# Patient Record
Sex: Male | Born: 1970 | Race: White | Hispanic: No | Marital: Married | Smoking: Current some day smoker
Health system: Southern US, Community
[De-identification: ages and names within clinical notes are randomized; demographics above are authoritative.]

## PROBLEM LIST (undated history)

## (undated) HISTORY — PX: GASTRIC BYPASS: SHX52

---

## 2006-07-01 ENCOUNTER — Emergency Department: Payer: Self-pay | Admitting: Emergency Medicine

## 2009-07-14 ENCOUNTER — Ambulatory Visit: Payer: Self-pay | Admitting: Internal Medicine

## 2010-07-13 ENCOUNTER — Emergency Department: Payer: Self-pay | Admitting: Emergency Medicine

## 2010-08-01 ENCOUNTER — Ambulatory Visit: Payer: Self-pay | Admitting: General Practice

## 2011-05-03 ENCOUNTER — Emergency Department: Payer: Self-pay | Admitting: Emergency Medicine

## 2015-01-02 ENCOUNTER — Ambulatory Visit: Payer: Self-pay | Admitting: Family Medicine

## 2018-05-17 ENCOUNTER — Emergency Department
Admission: EM | Admit: 2018-05-17 | Discharge: 2018-05-18 | Disposition: A | Discharge status: Home / Self Care | Payer: Self-pay | Attending: Emergency Medicine | Admitting: Emergency Medicine

## 2018-05-17 ENCOUNTER — Encounter: Payer: Self-pay | Admitting: Emergency Medicine

## 2018-05-17 ENCOUNTER — Other Ambulatory Visit: Payer: Self-pay

## 2018-05-17 DIAGNOSIS — T7805XA Anaphylactic reaction due to tree nuts and seeds, initial encounter: Secondary | ICD-10-CM | POA: Insufficient documentation

## 2018-05-17 DIAGNOSIS — T782XXA Anaphylactic shock, unspecified, initial encounter: Secondary | ICD-10-CM

## 2018-05-17 DIAGNOSIS — R21 Rash and other nonspecific skin eruption: Secondary | ICD-10-CM | POA: Insufficient documentation

## 2018-05-17 DIAGNOSIS — T7840XA Allergy, unspecified, initial encounter: Secondary | ICD-10-CM

## 2018-05-17 NOTE — ED Notes (Signed)
Ice chips provided.

## 2018-05-17 NOTE — ED Provider Notes (Signed)
[facility] Emergency Department Provider Note   ____________________________________________   First MD Initiated Contact with Patient 05/17/18 2307     (approximate)  I have reviewed the triage vital signs and the nursing notes.   HISTORY  Chief Complaint Allergic Reaction    HPI Mario Day is a 47 y.o. male who comes into the hospital today with an allergic reaction.  The patient states that he feels better but reports that this evening he was eating some cake that he thinks had some walnuts in it.  He reports that after eating the cake his face became red and he felt like he could not breathe.  He went to the bathroom to try to watch his face and developed some hives over his chest and some itching.  They called EMS and the patient did receive 2 EpiPen's as well as some Solu-Medrol, Benadryl and and Pepcid.  The patient states that a year ago he had a similar allergic reaction to peanuts.  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  He is here today for evaluation of his symptoms.   History reviewed. No pertinent past medical history.  There are no active problems to display for this patient.   Past Surgical History:  Procedure Laterality Date  . GASTRIC BYPASS      Prior to Admission medications   Medication Sig Start Date End Date Taking? Authorizing Provider  EPINEPHrine 0.3 mg/0.3 mL IJ SOAJ injection Inject 0.3 mLs (0.3 mg total) into the muscle once for 1 dose. As needed for facial swelling or difficulty breathing 05/18/18 05/18/18  Rebecka ApleyWebster, Finnlee Silvernail P, MD  famotidine (PEPCID) 40 MG tablet Take 1 tablet (40 mg total) by mouth every evening. 05/18/18 05/18/19  Rebecka ApleyWebster, Asaph Serena P, MD  predniSONE (DELTASONE) 20 MG tablet Take 3 tablets (60 mg total) by mouth daily. 05/18/18   Rebecka ApleyWebster, Briza Bark P, MD    Allergies Peanut-containing drug products  No family history on file.  Social  History Social History   Tobacco Use  . Smoking status: Current Some Day Smoker  . Smokeless tobacco: Never Used  Substance Use Topics  . Alcohol use: Yes  . Drug use: Not on file    Review of Systems  Constitutional: No fever/chills Eyes: No visual changes. ENT: No sore throat. Cardiovascular: Denies chest pain. Respiratory:  shortness of breath. Gastrointestinal: No abdominal pain.  No nausea, no vomiting.  No diarrhea.  No constipation. Genitourinary: Negative for dysuria. Musculoskeletal: Negative for back pain. Skin: hives and itching Neurological: Negative for headaches, focal weakness or numbness.   ____________________________________________   PHYSICAL EXAM:  VITAL SIGNS: ED Triage Vitals  Enc Vitals Group     BP 05/17/18 2244 (!) 141/89     Pulse Rate 05/17/18 2244 91     Resp 05/17/18 2244 (!) 24     Temp --      Temp src --      SpO2 05/17/18 2244 99 %     Weight 05/17/18 2246 195 lb (88.5 kg)     Height 05/17/18 2246 5\' 5"  (1.651 m)     Head Circumference --      Peak Flow --      Pain Score 05/17/18 2256 0     Pain Loc --      Pain Edu? --      Excl. in GC? --     Constitutional: Alert  and oriented. Well appearing and in no acute distress. Eyes: Conjunctivae are normal. PERRL. EOMI. Head: Atraumatic. Nose: No congestion/rhinnorhea. Mouth/Throat: Mucous membranes are moist.  Oropharynx non-erythematous. Cardiovascular: Normal rate, regular rhythm. Grossly normal heart sounds.  Good peripheral circulation. Respiratory: Normal respiratory effort.  No retractions. Lungs CTAB. Gastrointestinal: Soft and nontender. No distention.  Musculoskeletal: No lower extremity tenderness nor edema.   Neurologic:  Normal speech and language.  Skin:  Skin is warm, dry and intact.  Psychiatric: Mood and affect are normal.   ____________________________________________   LABS (all labs ordered are listed, but only abnormal results are displayed)  Labs  Reviewed - No data to display ____________________________________________  EKG  none ____________________________________________  RADIOLOGY  ED MD interpretation:  none  Official radiology report(s): No results found.  ____________________________________________   PROCEDURES  Procedure(s) performed: None  Procedures  Critical Care performed: No  ____________________________________________   INITIAL IMPRESSION / ASSESSMENT AND PLAN / ED COURSE  As part of my medical decision making, I reviewed the following data within the electronic MEDICAL RECORD NUMBER Notes from prior ED visits and [place] Controlled Substance Database   This is a 47 year old male who comes into the hospital today with an allergic reaction.  The patient received his medications by EMS.  By the time I evaluated the patient he was feeling much better and his hives had improved.  He has some mild skin erythema but he is breathing comfortably with no wheezing.  Since the patient did receive epinephrine he will be monitored for approximately 4 hours to ensure that he does not have any rebound of his allergic symptoms.  We will observe the patient during his time in the emergency room.  After 4 hours the patient states that he still feels good and he is ready to go home.  The patient will be discharged to follow-up with an allergist for evaluation of his allergy.      ____________________________________________   FINAL CLINICAL IMPRESSION(S) / ED DIAGNOSES  Final diagnoses:  Anaphylaxis, initial encounter  Allergic reaction, initial encounter     ED Discharge Orders        Ordered    predniSONE (DELTASONE) 20 MG tablet  Daily     05/18/18 0311    famotidine (PEPCID) 40 MG tablet  Every evening     05/18/18 0311    EPINEPHrine 0.3 mg/0.3 mL IJ SOAJ injection   Once     05/18/18 9147       Note:  This document was prepared using Dragon voice recognition software and may include unintentional  dictation errors.    Rebecka Apley, MD 05/18/18 [phone]

## 2018-05-17 NOTE — ED Notes (Signed)
Dr. Roxan Hockeyobinson at bedside to evaluate airway.

## 2018-05-17 NOTE — ED Notes (Signed)
md in to see pt.  

## 2018-05-17 NOTE — ED Notes (Signed)
Pt reports feeling "much better". No angioedema noted. No hives noted currently. resps unlabored.

## 2018-05-17 NOTE — ED Triage Notes (Signed)
Per ems pt with anaphylaxis. Pt with hives noted to chest wall, abd. Ems states hives have improved. Pt is allergic to nuts. Pt received epi 0.3mg  x2, solumedrol 125mg  iv, pepcid 20mg  iv, benadryl 50mg  iv, 4mg  zofran. Pt states he feels like his chest is heavy and "my throat is closing".

## 2018-05-18 MED ORDER — PREDNISONE 20 MG PO TABS: 60.0000 mg | ORAL_TABLET | Freq: Every day | ORAL | 0 refills | Status: AC

## 2018-05-18 MED ORDER — FAMOTIDINE 40 MG PO TABS
40.0000 mg | ORAL_TABLET | Freq: Every evening | ORAL | 0 refills | Status: AC
Start: 2018-05-18 — End: 2019-05-18

## 2018-05-18 MED ORDER — EPINEPHRINE 0.3 MG/0.3ML IJ SOAJ: 0.3000 mg | Freq: Once | INTRAMUSCULAR | 0 refills | Status: AC

## 2018-05-18 NOTE — ED Notes (Signed)
Pt up to commode to void.  

## 2018-05-18 NOTE — Discharge Instructions (Signed)
Please follow up with an allergist for further evaluation of your symptoms. Please return with any other concerns

## 2018-05-18 NOTE — ED Notes (Signed)
Pt states he wants to go home. md notified.

## 2018-05-18 NOTE — ED Notes (Signed)
Pt reports feeling much improved. resps unlabored.

## 2018-05-18 NOTE — ED Notes (Signed)
Pt states "I feel really good". Explanation of continued monitoring time provided again to pt and spouse who verbalize understanding.

## 2018-06-28 ENCOUNTER — Emergency Department: Payer: Worker's Compensation

## 2018-06-28 ENCOUNTER — Emergency Department
Admission: EM | Admit: 2018-06-28 | Discharge: 2018-06-28 | Disposition: A | Discharge status: Home / Self Care | Payer: Worker's Compensation | Attending: Emergency Medicine | Admitting: Emergency Medicine

## 2018-06-28 ENCOUNTER — Encounter: Payer: Self-pay | Admitting: Emergency Medicine

## 2018-06-28 ENCOUNTER — Other Ambulatory Visit: Payer: Self-pay

## 2018-06-28 DIAGNOSIS — F17200 Nicotine dependence, unspecified, uncomplicated: Secondary | ICD-10-CM | POA: Diagnosis not present

## 2018-06-28 DIAGNOSIS — Z79899 Other long term (current) drug therapy: Secondary | ICD-10-CM | POA: Insufficient documentation

## 2018-06-28 DIAGNOSIS — Y9301 Activity, walking, marching and hiking: Secondary | ICD-10-CM | POA: Insufficient documentation

## 2018-06-28 DIAGNOSIS — S99911A Unspecified injury of right ankle, initial encounter: Secondary | ICD-10-CM | POA: Diagnosis present

## 2018-06-28 DIAGNOSIS — Y998 Other external cause status: Secondary | ICD-10-CM | POA: Diagnosis not present

## 2018-06-28 DIAGNOSIS — Z91010 Allergy to peanuts: Secondary | ICD-10-CM | POA: Diagnosis not present

## 2018-06-28 DIAGNOSIS — W010XXA Fall on same level from slipping, tripping and stumbling without subsequent striking against object, initial encounter: Secondary | ICD-10-CM | POA: Insufficient documentation

## 2018-06-28 DIAGNOSIS — S82851A Displaced trimalleolar fracture of right lower leg, initial encounter for closed fracture: Secondary | ICD-10-CM | POA: Diagnosis not present

## 2018-06-28 DIAGNOSIS — Y929 Unspecified place or not applicable: Secondary | ICD-10-CM | POA: Insufficient documentation

## 2018-06-28 MED ORDER — NAPROXEN 500 MG PO TABS: 500.0000 mg | ORAL_TABLET | Freq: Two times a day (BID) | ORAL | 0 refills | Status: AC

## 2018-06-28 MED ORDER — ETOMIDATE 2 MG/ML IV SOLN
0.3000 mg/kg | Freq: Once | INTRAVENOUS | Status: DC
  Filled 2018-06-28: qty 20

## 2018-06-28 MED ORDER — ETOMIDATE 2 MG/ML IV SOLN
INTRAVENOUS | Status: AC | PRN
  Administered 2018-06-28: 27.9 mg via INTRAVENOUS

## 2018-06-28 MED ORDER — OXYCODONE-ACETAMINOPHEN 5-325 MG PO TABS
1.0000 | ORAL_TABLET | Freq: Once | ORAL | Status: AC
  Administered 2018-06-28: 1 via ORAL
  Filled 2018-06-28: qty 1

## 2018-06-28 MED ORDER — SODIUM CHLORIDE 0.9 % IV BOLUS
1000.0000 mL | Freq: Once | INTRAVENOUS | Status: AC
  Administered 2018-06-28: 1000 mL via INTRAVENOUS

## 2018-06-28 MED ORDER — MORPHINE SULFATE (PF) 4 MG/ML IV SOLN
4.0000 mg | Freq: Once | INTRAVENOUS | Status: AC
  Administered 2018-06-28: 4 mg via INTRAVENOUS
  Filled 2018-06-28: qty 1

## 2018-06-28 MED ORDER — OXYCODONE-ACETAMINOPHEN 5-325 MG PO TABS: 1.0000 | ORAL_TABLET | Freq: Four times a day (QID) | ORAL | 0 refills | Status: AC | PRN

## 2018-06-28 NOTE — ED Notes (Signed)
Pt received 100 mcg fentanyl 

## 2018-06-28 NOTE — Sedation Documentation (Signed)
Etomidate was given over 2 doses. Ankle reduced and splint applied by dr and techs

## 2018-06-28 NOTE — ED Notes (Signed)
Pt states he works at [employer] through [employer]. At work [employer] is not listed in our computer. Advised pt to call his supervisor to see if they need anything (ie: urine drug screen or BAT). Pt states he will take care of it and for us "not to worry about it".

## 2018-06-28 NOTE — ED Notes (Signed)
Pt ate all of his food tray

## 2018-06-28 NOTE — Sedation Documentation (Signed)
Pt alert, holding a conversation. States the pain is down to 5/10. Awaiting post reductions xr.

## 2018-06-28 NOTE — Sedation Documentation (Signed)
Vital signs stable. 

## 2018-06-28 NOTE — ED Provider Notes (Signed)
[facility] Emergency Department Provider Note  ____________________________________________  Time seen: Approximately 7:19 PM  I have reviewed the triage vital signs and the nursing notes.   HISTORY  Chief Complaint Ankle Injury    HPI Mario Day is a 47 y.o. male with no significant past medical history who was in his usual state of health when he slipped on a wet floor.  Discussed him to fall straight back, landing on his back.  He denies head injury or loss of consciousness.  He did note that his ankle seem to turn sideways and he had immediate severe sudden pain that was nonradiating, worse with any movement, no alleviating factors, aching.  No other complaints other than ankle pain.   Last ate this morning 1030 today.   History reviewed. No pertinent past medical history.   There are no active problems to display for this patient.    Past Surgical History:  Procedure Laterality Date  . GASTRIC BYPASS       Prior to Admission medications   Medication Sig Start Date End Date Taking? Authorizing Provider  famotidine (PEPCID) 40 MG tablet Take 1 tablet (40 mg total) by mouth every evening. 05/18/18 05/18/19  Rebecka Apley, MD  naproxen (NAPROSYN) 500 MG tablet Take 1 tablet (500 mg total) by mouth 2 (two) times daily with a meal. 06/28/18   Sharman Cheek, MD  oxyCODONE-acetaminophen (PERCOCET) 5-325 MG tablet Take 1 tablet by mouth every 6 (six) hours as needed for severe pain. 06/28/18 06/28/19  Sharman Cheek, MD  predniSONE (DELTASONE) 20 MG tablet Take 3 tablets (60 mg total) by mouth daily. 05/18/18   Rebecka Apley, MD     Allergies Peanut-containing drug products   History reviewed. No pertinent family history.  Social History Social History   Tobacco Use  . Smoking status: Current Some Day Smoker  . Smokeless tobacco: Never Used  Substance Use Topics  . Alcohol use: Yes  . Drug use: Not on file    Review of  Systems  Constitutional:   No fever or chills.  ENT:   No sore throat. No rhinorrhea. Cardiovascular:   No chest pain or syncope. Respiratory:   No dyspnea or cough. Gastrointestinal:   Negative for abdominal pain, vomiting and diarrhea.  Musculoskeletal:   Negative for focal pain or swelling All other systems reviewed and are negative except as documented above in ROS and HPI.  ____________________________________________   PHYSICAL EXAM:  VITAL SIGNS: ED Triage Vitals  Enc Vitals Group     BP 06/28/18 1822 126/67     Pulse Rate 06/28/18 1822 (!) 54     Resp 06/28/18 1822 16     Temp 06/28/18 1822 98.8 F (37.1 C)     Temp Source 06/28/18 1822 Oral     SpO2 06/28/18 1822 99 %     Weight 06/28/18 1823 205 lb (93 kg)     Height 06/28/18 1823 5\' 10"  (1.778 m)     Head Circumference --      Peak Flow --      Pain Score 06/28/18 1823 7     Pain Loc --      Pain Edu? --      Excl. in GC? --     Vital signs reviewed, nursing assessments reviewed.   Constitutional:   Alert and oriented. Non-toxic appearance. Eyes:   Conjunctivae are normal. EOMI. PERRL. ENT      Head:   Normocephalic and atraumatic.  Nose:   No congestion/rhinnorhea.  No epistaxis      Mouth/Throat:   MMM, no pharyngeal erythema. No peritonsillar mass.       Neck:   No meningismus. Full ROM.  No midline spinal tenderness Hematological/Lymphatic/Immunilogical:   No cervical lymphadenopathy. Cardiovascular:   RRR. Symmetric bilateral radial and DP pulses.  No murmurs. Cap refill less than 2 seconds. Respiratory:   Normal respiratory effort without tachypnea/retractions. Breath sounds are clear and equal bilaterally. No wheezes/rales/rhonchi. Gastrointestinal:   Soft and nontender. Non distended. There is no CVA tenderness.  No rebound, rigidity, or guarding.  Musculoskeletal:   Normal range of motion in all extremities except right ankle which is obviously deformed with tenting of the skin in the area of  the medial malleolus..  Toes are warm, well-perfused, normal capillary refill Neurologic:   Normal speech and language.  Motor grossly intact. No acute focal neurologic deficits are appreciated.  Skin:    Skin is warm, dry and intact. No rash noted.  No petechiae, purpura, or bullae.  No laceration ____________________________________________    LABS (pertinent positives/negatives) (all labs ordered are listed, but only abnormal results are displayed) Labs Reviewed - No data to display ____________________________________________   EKG    ____________________________________________    RADIOLOGY  Dg Ankle 2 Views Right  Result Date: 06/28/2018 CLINICAL DATA:  Postreduction EXAM: RIGHT ANKLE - 2 VIEW COMPARISON:  06/28/2018 FINDINGS: Interval reduction of the previously seen dislocated right ankle. Trimalleolar fracture again noted with near anatomic alignment. IMPRESSION: Interval reduction of the trimalleolar fracture dislocation. Electronically Signed   By: Charlett Nose M.D.   On: 06/28/2018 21:02   Dg Ankle Complete Right  Result Date: 06/28/2018 CLINICAL DATA:  Fall, ankle pain EXAM: RIGHT ANKLE - COMPLETE 3+ VIEW COMPARISON:  None. FINDINGS: Fracture dislocation at the right ankle. Trimalleolar fracture noted. There is posterior dislocation of the talus relative to the tibia. IMPRESSION: Trimalleolar fracture dislocation. Electronically Signed   By: Charlett Nose M.D.   On: 06/28/2018 19:03    ____________________________________________   PROCEDURES .Sedation Date/Time: 06/28/2018 7:19 PM Performed by: Sharman Cheek, MD Authorized by: Sharman Cheek, MD   Consent:    Consent obtained:  Verbal (electronic informed consent)   Consent given by:  Patient   Risks discussed:  Allergic reaction, dysrhythmia, inadequate sedation, nausea, vomiting, respiratory compromise necessitating ventilatory assistance and intubation, prolonged sedation necessitating reversal and  prolonged hypoxia resulting in organ damage   Alternatives discussed:  Analgesia without sedation Universal protocol:    Procedure explained and questions answered to patient or proxy's satisfaction: yes     Relevant documents present and verified: yes     Test results available and properly labeled: yes     Imaging studies available: yes     Required blood products, implants, devices, and special equipment available: yes     Immediately prior to procedure a time out was called: yes     Patient identity confirmation method:  Arm band and verbally with patient Indications:    Procedure performed:  Fracture reduction   Procedure necessitating sedation performed by:  Physician performing sedation   Intended level of sedation:  Moderate (conscious sedation) Pre-sedation assessment:    Time since last food or drink:  9 hours   ASA classification: class 1 - normal, healthy patient     Neck mobility: normal     Mouth opening:  3 or more finger widths   Thyromental distance:  3 finger widths   Mallampati  score:  I - soft palate, uvula, fauces, pillars visible   Pre-sedation assessments completed and reviewed: airway patency, cardiovascular function, hydration status, mental status, nausea/vomiting, pain level and respiratory function     Pre-sedation assessment completed:  06/28/2018 7:20 PM Immediate pre-procedure details:    Reassessment: Patient reassessed immediately prior to procedure     Reviewed: vital signs, relevant labs/tests and NPO status     Verified: bag valve mask available, emergency equipment available, intubation equipment available, IV patency confirmed, oxygen available, reversal medications available and suction available   Procedure details (see MAR for exact dosages):    Preoxygenation:  Nasal cannula   Sedation:  Etomidate   Intra-procedure monitoring:  Blood pressure monitoring, continuous pulse oximetry, cardiac monitor, frequent vital sign checks and frequent LOC  assessments   Intra-procedure events: none     Total Provider sedation time (minutes):  10 Post-procedure details:    Post-sedation assessment completed:  06/28/2018 8:23 PM   Attendance: Constant attendance by certified staff until patient recovered     Recovery: Patient returned to pre-procedure baseline     Post-sedation assessments completed and reviewed: airway patency, cardiovascular function, hydration status, mental status, nausea/vomiting, pain level and respiratory function     Patient is stable for discharge or admission: yes     Patient tolerance:  Tolerated well, no immediate complications Comments:     Verbal consent obtained from patient in presence of spouse, who agree with procedure and reduction of fracture/dislocation     Reduction of fracture Date/Time: 06/28/2018 8:24 PM Performed by: Sharman CheekStafford, Zayn Selley, MD Authorized by: Sharman CheekStafford, Blaire Palomino, MD  Consent: Verbal consent obtained. Risks and benefits: risks, benefits and alternatives were discussed Consent given by: patient Patient understanding: patient states understanding of the procedure being performed Patient consent: the patient's understanding of the procedure matches consent given Procedure consent: procedure consent matches procedure scheduled Relevant documents: relevant documents present and verified Imaging studies: imaging studies available Patient identity confirmed: verbally with patient and arm band Time out: Immediately prior to procedure a "time out" was called to verify the correct patient, procedure, equipment, support staff and site/side marked as required. Local anesthesia used: no  Anesthesia: Local anesthesia used: no  Sedation: Patient sedated: yes Sedatives: etomidate (see sedation template) Vitals: Vital signs were monitored during sedation.  Patient tolerance: Patient tolerated the procedure well with no immediate complications  .Splint Application Date/Time: 06/28/2018 9:48  PM Performed by: Sharman CheekStafford, Taiquan Campanaro, MD Authorized by: Sharman CheekStafford, Patrick Sohm, MD   Consent:    Consent obtained:  Verbal   Consent given by:  Patient   Risks discussed:  Discoloration, pain, swelling and numbness   Alternatives discussed:  Referral Pre-procedure details:    Sensation:  Normal Procedure details:    Laterality:  Right   Location:  Ankle   Ankle:  R ankle   Cast type:  Short leg   Splint type:  Ankle stirrup and stack splint   Supplies:  Ortho-Glass Post-procedure details:    Pain:  Improved   Sensation:  Normal   Skin color:  Pink   Patient tolerance of procedure:  Tolerated well, no immediate complications Comments:     Applied by me with ED tech assistance    ____________________________________________    CLINICAL IMPRESSION / ASSESSMENT AND PLAN / ED COURSE  Pertinent labs & imaging results that were available during my care of the patient were reviewed by me and considered in my medical decision making (see chart for details).  Clinical Course as of Jun 28 2146  Sat Jun 28, 2018  1610 Patient presents with clinically apparent right ankle injury, fracture versus dislocation or both.  Will obtain x-ray of the right ankle.  This appears to be an isolated injury.  C-spine is clinically clear.  IV morphine 4 mg for additional pain control.  Discussed potential moderate/deep sedation with the patient for reduction and splinting, to which he agrees.  Last ate at 10 AM today.   [PS]  2031 D/w ortho - requests to send pre and post reduction xrays to advise on follow up plan.   [PS]  2108 D/w ortho, adequate reduction, outpatient follow up. NWB on crutches.    [PS]    Clinical Course User Index [PS] Sharman Cheek, MD     ----------------------------------------- 8:26 PM on 06/28/2018 -----------------------------------------  Fracture reduction completed under moderate sedation.  No desaturations or other intraprocedure complications.  3 sided  splint applied, ankle in 90 degree dorsiflexion.  After splint application, intact sensation, distal pulse in the dorsalis pedis, and normal color and capillary refill less than 2 seconds in the toes.  Repeat xray to eval reduction  ----------------------------------------- 9:46 PM on 06/28/2018 ----------------------------------------- Pain much improved, 5/10.  Tolerating oral intake.  Suitable for discharge home with crutches.  ____________________________________________   FINAL CLINICAL IMPRESSION(S) / ED DIAGNOSES    Final diagnoses:  Trimalleolar fracture of ankle, closed, right, initial encounter     ED Discharge Orders        Ordered    naproxen (NAPROSYN) 500 MG tablet  2 times daily with meals     06/28/18 2141    oxyCODONE-acetaminophen (PERCOCET) 5-325 MG tablet  Every 6 hours PRN     06/28/18 2141      Portions of this note were generated with dragon dictation software. Dictation errors may occur despite best attempts at proofreading.    Sharman Cheek, MD 06/28/18 2152

## 2018-06-28 NOTE — Sedation Documentation (Signed)
Patient is resting comfortably. 

## 2018-06-28 NOTE — ED Triage Notes (Signed)
Pt from work - slipped on wet floor - rt ankle injury with deformity

## 2018-08-07 ENCOUNTER — Other Ambulatory Visit: Payer: Self-pay

## 2018-08-07 ENCOUNTER — Emergency Department: Payer: PRIVATE HEALTH INSURANCE

## 2018-08-07 ENCOUNTER — Emergency Department
Admission: EM | Admit: 2018-08-07 | Discharge: 2018-08-07 | Disposition: A | Discharge status: Other Acute Inpatient Hospital | Payer: PRIVATE HEALTH INSURANCE | Attending: Emergency Medicine | Admitting: Emergency Medicine

## 2018-08-07 ENCOUNTER — Encounter: Payer: Self-pay | Admitting: Emergency Medicine

## 2018-08-07 DIAGNOSIS — F17200 Nicotine dependence, unspecified, uncomplicated: Secondary | ICD-10-CM | POA: Diagnosis not present

## 2018-08-07 DIAGNOSIS — W109XXA Fall (on) (from) unspecified stairs and steps, initial encounter: Secondary | ICD-10-CM | POA: Diagnosis not present

## 2018-08-07 DIAGNOSIS — S52501A Unspecified fracture of the lower end of right radius, initial encounter for closed fracture: Secondary | ICD-10-CM | POA: Insufficient documentation

## 2018-08-07 DIAGNOSIS — Y9301 Activity, walking, marching and hiking: Secondary | ICD-10-CM | POA: Insufficient documentation

## 2018-08-07 DIAGNOSIS — S0292XA Unspecified fracture of facial bones, initial encounter for closed fracture: Secondary | ICD-10-CM | POA: Diagnosis not present

## 2018-08-07 DIAGNOSIS — Y999 Unspecified external cause status: Secondary | ICD-10-CM | POA: Diagnosis not present

## 2018-08-07 DIAGNOSIS — Z23 Encounter for immunization: Secondary | ICD-10-CM | POA: Insufficient documentation

## 2018-08-07 DIAGNOSIS — Y929 Unspecified place or not applicable: Secondary | ICD-10-CM | POA: Diagnosis not present

## 2018-08-07 DIAGNOSIS — S59911A Unspecified injury of right forearm, initial encounter: Secondary | ICD-10-CM | POA: Diagnosis present

## 2018-08-07 MED ORDER — MORPHINE SULFATE (PF) 4 MG/ML IV SOLN
4.0000 mg | Freq: Once | INTRAVENOUS | Status: AC
  Administered 2018-08-07: 4 mg via INTRAVENOUS

## 2018-08-07 MED ORDER — MORPHINE SULFATE (PF) 4 MG/ML IV SOLN
INTRAVENOUS | Status: AC
  Filled 2018-08-07: qty 1

## 2018-08-07 MED ORDER — TETANUS-DIPHTH-ACELL PERTUSSIS 5-2.5-18.5 LF-MCG/0.5 IM SUSP
0.5000 mL | Freq: Once | INTRAMUSCULAR | Status: AC
  Administered 2018-08-07: 0.5 mL via INTRAMUSCULAR
  Filled 2018-08-07: qty 0.5

## 2018-08-07 MED ORDER — HYDROMORPHONE HCL 1 MG/ML IJ SOLN
1.0000 mg | Freq: Once | INTRAMUSCULAR | Status: AC
  Administered 2018-08-07: 1 mg via INTRAVENOUS
  Filled 2018-08-07: qty 1
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
## 2018-08-07 NOTE — ED Provider Notes (Signed)
[facility] Emergency Department Provider Note  ____________________________________________  Time seen: Approximately 2:55 PM  I have reviewed the triage vital signs and the nursing notes.   HISTORY  Chief Complaint Fall    HPI Mario Day is a 47 y.o. male with no significant past medical history who is about 4 weeks postop from a ORIF of right trimalleolar ankle fracture.  Today he was going downstairs when his leg "gave out" due to the persistent pain in his ankle, causing him to fall down the stairs and hit his head against the wall.  No loss of consciousness.  He currently complains of pain in the left face but denies neck pain.  No vision changes.  No paresthesias or weakness.  Also has pain in the right wrist.  Pain is acute onset, severe, nonradiating, worse with movement, no alleviating factors.  Right ankle pain is at baseline after the fall   History reviewed. No pertinent past medical history.   There are no active problems to display for this patient.    Past Surgical History:  Procedure Laterality Date  . GASTRIC BYPASS       Prior to Admission medications   Medication Sig Start Date End Date Taking? Authorizing Provider  famotidine (PEPCID) 40 MG tablet Take 1 tablet (40 mg total) by mouth every evening. Patient not taking: Reported on 08/07/2018 05/18/18 05/18/19  Rebecka Apley, MD  naproxen (NAPROSYN) 500 MG tablet Take 1 tablet (500 mg total) by mouth 2 (two) times daily with a meal. Patient not taking: Reported on 08/07/2018 06/28/18   Sharman Cheek, MD  oxyCODONE-acetaminophen (PERCOCET) 5-325 MG tablet Take 1 tablet by mouth every 6 (six) hours as needed for severe pain. Patient not taking: Reported on 08/07/2018 06/28/18 06/28/19  Sharman Cheek, MD  predniSONE (DELTASONE) 20 MG tablet Take 3 tablets (60 mg total) by mouth daily. Patient not taking: Reported on 08/07/2018 05/18/18   Rebecka Apley, MD      Allergies Peanut-containing drug products   History reviewed. No pertinent family history.  Social History Social History   Tobacco Use  . Smoking status: Current Some Day Smoker  . Smokeless tobacco: Never Used  Substance Use Topics  . Alcohol use: Yes  . Drug use: Not on file    Review of Systems  Constitutional:   No fever or chills.  ENT:   No sore throat. No rhinorrhea. Cardiovascular:   No chest pain or syncope. Respiratory:   No dyspnea or cough. Gastrointestinal:   Negative for abdominal pain, vomiting and diarrhea.  Musculoskeletal: Right forearm fracture.  Chronic right ankle fracture All other systems reviewed and are negative except as documented above in ROS and HPI.  ____________________________________________   PHYSICAL EXAM:  VITAL SIGNS: ED Triage Vitals  Enc Vitals Group     BP 08/07/18 0912 (!) 137/93     Pulse Rate 08/07/18 0912 62     Resp 08/07/18 0912 16     Temp 08/07/18 0912 (!) 97.4 F (36.3 C)     Temp Source 08/07/18 0912 Oral     SpO2 08/07/18 0912 98 %     Weight 08/07/18 0910 205 lb (93 kg)     Height 08/07/18 0910 5\' 10"  (1.778 m)     Head Circumference --      Peak Flow --      Pain Score 08/07/18 0910 7     Pain Loc --      Pain Edu? --  Excl. in GC? --     Vital signs reviewed, nursing assessments reviewed.   Constitutional:   Alert and oriented. Non-toxic appearance. Eyes:   Conjunctivae are normal. EOMI. PERRL. ENT      Head:   Normocephalic with swelling bruising and diffuse tenderness around the left orbit, zygoma, maxilla.  No crepitus.  No laceration.  No hemotympanum.      Nose:   No congestion/rhinnorhea.  Nontender, no epistaxis, no septal hematoma      Mouth/Throat:   MMM, no pharyngeal erythema. No peritonsillar mass.       Neck:   No meningismus. Full ROM.  No midline tenderness Hematological/Lymphatic/Immunilogical:   No cervical lymphadenopathy. Cardiovascular:   RRR. Symmetric bilateral  radial and DP pulses.  No murmurs. Cap refill less than 2 seconds. Respiratory:   Normal respiratory effort without tachypnea/retractions. Breath sounds are clear and equal bilaterally. No wheezes/rales/rhonchi. Gastrointestinal:   Soft and nontender. Non distended. There is no CVA tenderness.  No rebound, rigidity, or guarding. Genitourinary:   deferred Musculoskeletal:   Right distal radius and wrist swelling and tenderness.  Deformity.  Right proximal forearm and elbow unremarkable.  Hand unremarkable.  Other extremity is unremarkable.  Right lower leg in a walking boot without tenderness or swelling. Neurologic:   Normal speech and language.  Motor grossly intact. No acute focal neurologic deficits are appreciated.  Skin:    Skin is warm, dry and intact. No rash noted.  No petechiae, purpura, or bullae.  ____________________________________________    LABS (pertinent positives/negatives) (all labs ordered are listed, but only abnormal results are displayed) Labs Reviewed - No data to display ____________________________________________   EKG    ____________________________________________    RADIOLOGY  Dg Forearm Right  Result Date: 08/07/2018 CLINICAL DATA:  Right distal forearm pain. EXAM: RIGHT FOREARM - 2 VIEW COMPARISON:  No recent prior. FINDINGS: Comminuted fracture of the distal radius with extension into the radiocarpal joint space noted. Angulation deformity. Tiny bony densities noted posterior to the proximal carpal row. These could represent small fracture fragments, possibly from the triquetrum. IMPRESSION: 1. Comminuted fracture of the distal radius with extension into the radiocarpal joint space. Angulation deformity. 2. Tiny bony densities noted posterior to the proximal carpal row. These could represent small fracture fragments, possibly from the triquetrum. Electronically Signed   By: [name]  Register   On: 08/07/2018 10:09   Ct Maxillofacial Wo  Contrast  Result Date: 08/07/2018 CLINICAL DATA:  47 year old male with acute LEFT facial pain and swelling following fall. Initial encounter. EXAM: CT MAXILLOFACIAL WITHOUT CONTRAST TECHNIQUE: Multidetector CT imaging of the maxillofacial structures was performed. Multiplanar CT image reconstructions were also generated. COMPARISON:  07/18/2011 head CT FINDINGS: Osseous: Multiple fractures are noted as follows: A comminuted LEFT orbital floor fracture without definite displacement. No gross ocular muscle herniation noted. Fracture of the LATERAL LEFT orbital wall with some depression of the LATERAL orbital wall in 6 mm displacement at the junction of the LATERAL orbital wall and LEFT zygoma. Nondisplaced MEDIAL LEFT orbital wall fracture. Nondisplaced fractures of the LEFT zygoma and LEFT nasal bone. Fractures of the ANTERIOR, LATERAL and MEDIAL LEFT maxillary sinus walls as well as the anterior maxillary process. The pterygoid plates are intact. Orbits: Fractures as described above. The globes retain their spherical shape. No intraconal abnormality. Sinuses: Fluid/blood within the LEFT maxillary sinus noted. Soft tissues: Facial soft tissue swelling is identified, LEFT-greater-than-RIGHT. Small foci of gas within the LEFT facial soft tissues noted. Limited intracranial: No acute  abnormality. IMPRESSION: 1. Facial fractures as described including the LEFT orbital floor without definite displacement or gross extraocular muscle herniation, MEDIAL and LATERAL LEFT orbital wall, LEFT zygoma, LEFT nasal bone, and LEFT maxillary sinus walls. Overlying soft tissue swelling. Electronically Signed   By: Harmon Pier M.D.   On: 08/07/2018 10:39    ____________________________________________   PROCEDURES .Splint Application Date/Time: 08/07/2018 2:58 PM Performed by: Sharman Cheek, MD Authorized by: Sharman Cheek, MD   Consent:    Consent obtained:  Verbal   Consent given by:  Patient   Risks discussed:   Discoloration and numbness   Alternatives discussed:  No treatment Pre-procedure details:    Sensation:  Normal Procedure details:    Laterality:  Right   Location:  Arm   Splint type:  Sugar tong   Supplies:  Ortho-Glass Post-procedure details:    Pain:  Unchanged   Sensation:  Normal   Patient tolerance of procedure:  Tolerated well, no immediate complications    ____________________________________________  DIFFERENTIAL DIAGNOSIS   Facial fracture, orbital floor blowout fracture, distal radius fracture  CLINICAL IMPRESSION / ASSESSMENT AND PLAN / ED COURSE  Pertinent labs & imaging results that were available during my care of the patient were reviewed by me and considered in my medical decision making (see chart for details).    Patient presents after a fall.  Exam concerning for distal forearm fracture and facial fractures.  X-ray of the right forearm does show a distal radius fracture with extension into the intra-articular surface and possible carpal fracture.  CT of the face also shows extensive fractures including the medial, inferior, lateral walls of the orbit, zygoma, and medial lateral and inferior maxilla.  Overall this represents significant instability of the facial structure.  Case was discussed with ED attending May Yen at [facility] who accepts for trauma evaluation.  Initial fracture management provided with continued IV Dilaudid for pain control and sugar tong splint application to the right forearm.  No specific treatment for the facial fractures at this time pending specialist consultation but we will place a cervical collar for transport.        ____________________________________________   FINAL CLINICAL IMPRESSION(S) / ED DIAGNOSES    Final diagnoses:  Closed fracture of distal end of right radius, unspecified fracture morphology, initial encounter  Multiple facial fractures, closed, initial encounter [facility])     ED Discharge Orders    None       Portions of this note were generated with dragon dictation software. Dictation errors may occur despite best attempts at proofreading.    Sharman Cheek, MD 08/07/18 1500

## 2018-08-07 NOTE — ED Notes (Signed)
Patient transported to radiology

## 2018-08-07 NOTE — ED Notes (Signed)
Waiting on [facility] to transport to [facility] ED

## 2018-08-07 NOTE — ED Notes (Addendum)
Pt reports pain begins mid-forearm on right arm and hurts to right hand. Able to wiggle fingers, although difficult. Swelling noted to right lower forearm/wrist.  Pt has bruising below left eye.

## 2018-08-07 NOTE — ED Notes (Signed)
Pt back in room from radiology.

## 2018-08-07 NOTE — ED Notes (Signed)
EMTALA reviewed by this RN.  

## 2018-08-07 NOTE — ED Triage Notes (Signed)
Pt had surgery to right ankle 2 weeks ago at [facility]. Has been out of pain meds and unable to get in touch with them to refill.  Pt was walking down stairs and right leg gave way from pain and fell down about 5 stairs.  Abrasions and bruising to left side of face.  Appears to have mild deformity to right wrist.

## 2018-08-07 NOTE — ED Notes (Signed)
Tammy, EMT-P, applied splint per MD order.

## 2019-09-17 IMAGING — CT CT MAXILLOFACIAL W/O CM
3 series · 16 of 47 positions shown, 19 images · non-contrast
Comparison: 07/18/2011 head CT

CLINICAL DATA: 47-year-old male with acute LEFT facial pain and
swelling following fall. Initial encounter.

EXAM:
CT MAXILLOFACIAL WITHOUT CONTRAST
TECHNIQUE: Multidetector CT imaging of the maxillofacial structures was
performed. Multiplanar CT image reconstructions were also generated.

[Series 2: max soft · axial · 0.36mm/px · z∈[-307,-167]mm · 10 of 82 slices shown, 13 images]
[im 6/82  brain]
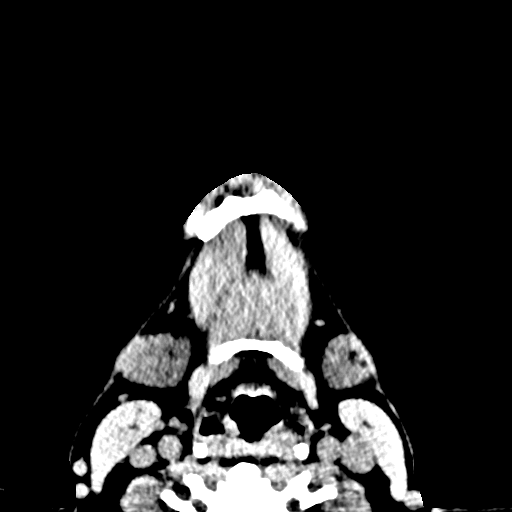
[im 6/82  bone]
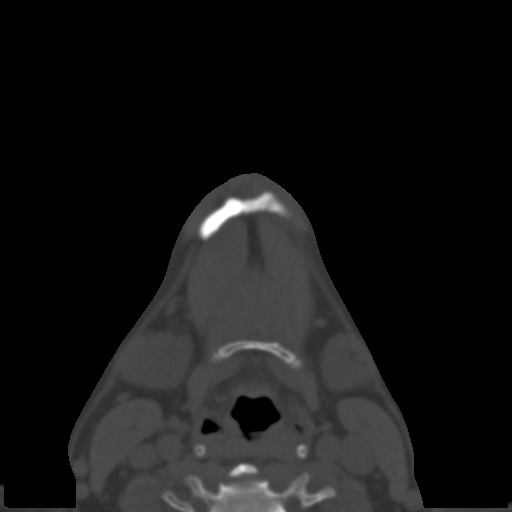
[im 14/82  bone]
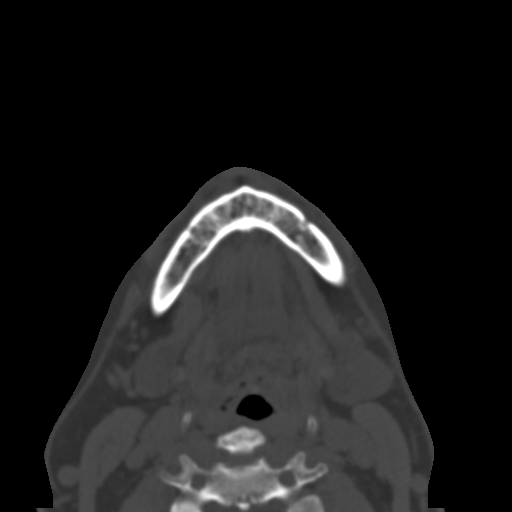
[im 23/82  bone]
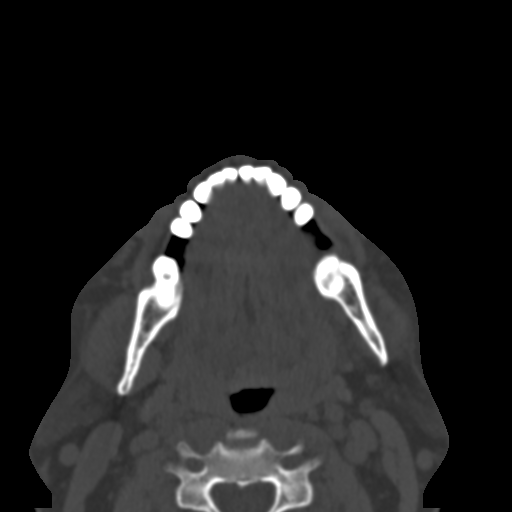
[im 28/82  bone]
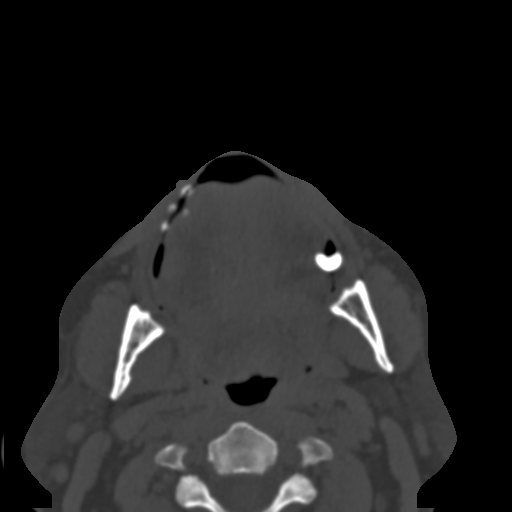
[im 37/82  brain]
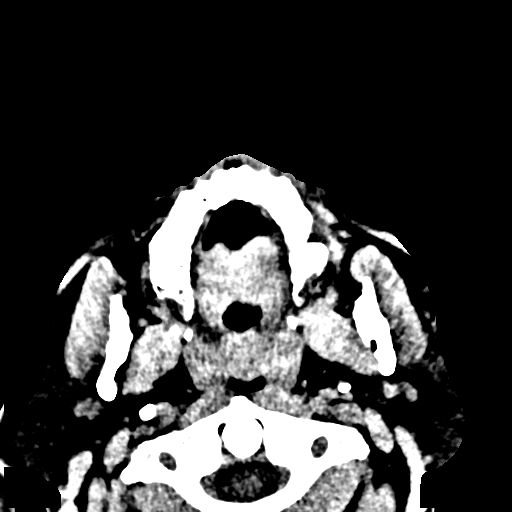
[im 37/82  bone]
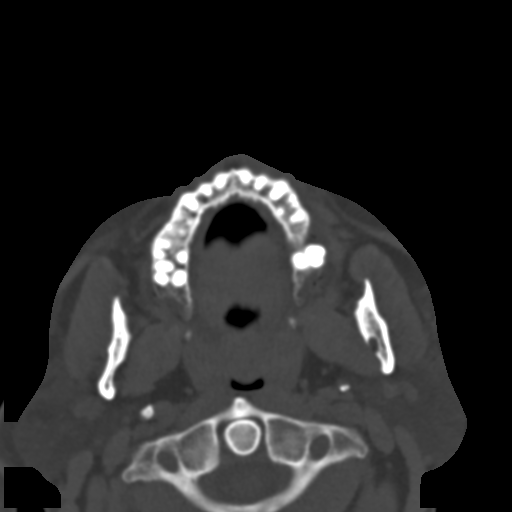
[im 45/82  bone]
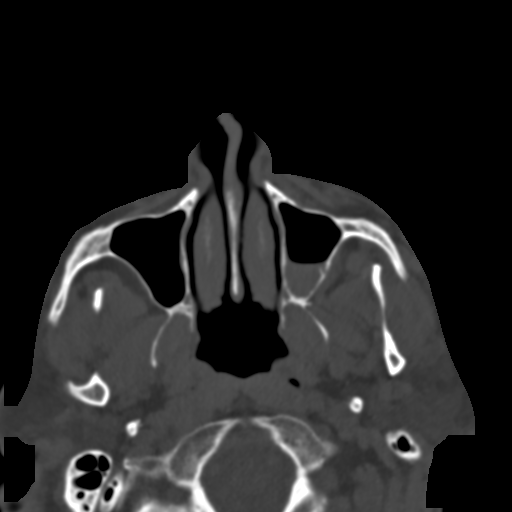
[im 54/82  bone]
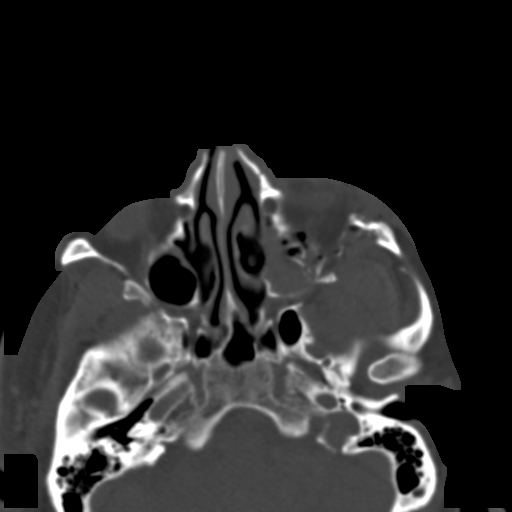
[im 62/82  bone]
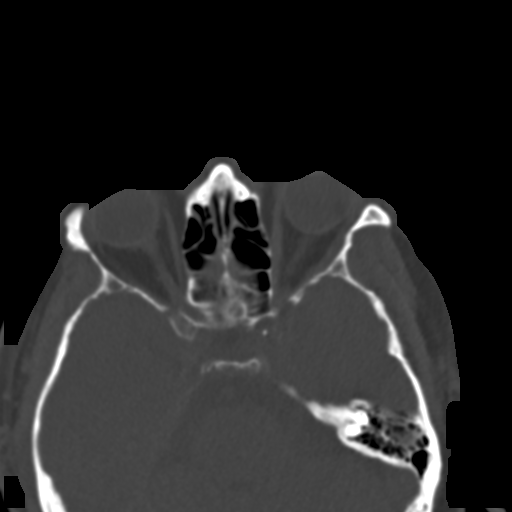
[im 68/82  brain]
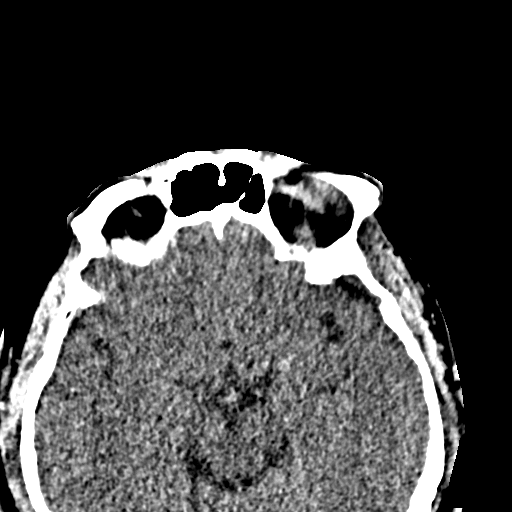
[im 68/82  bone]
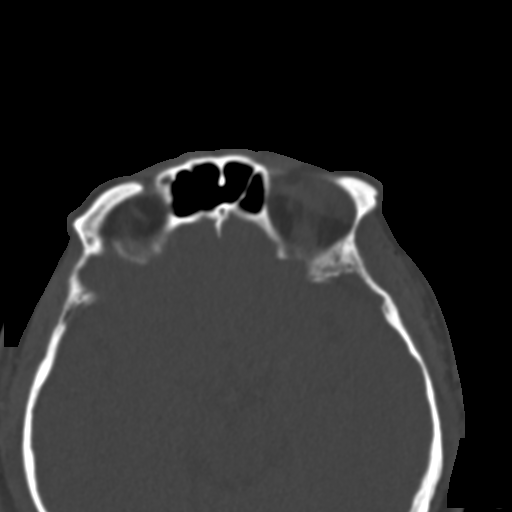
[im 76/82  bone]
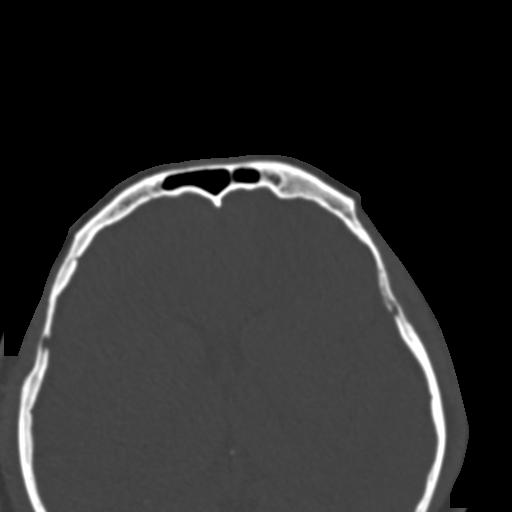

[Series 6: coronal soft · coronal · 0.35mm/px · 3 of 74 slices shown]
[im 25/74  bone]
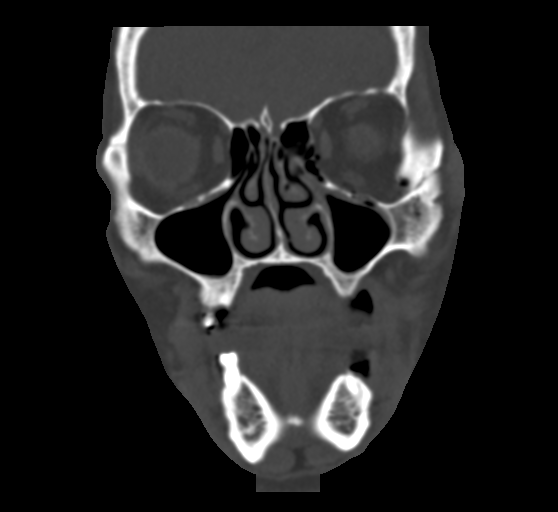
[im 33/74  bone]
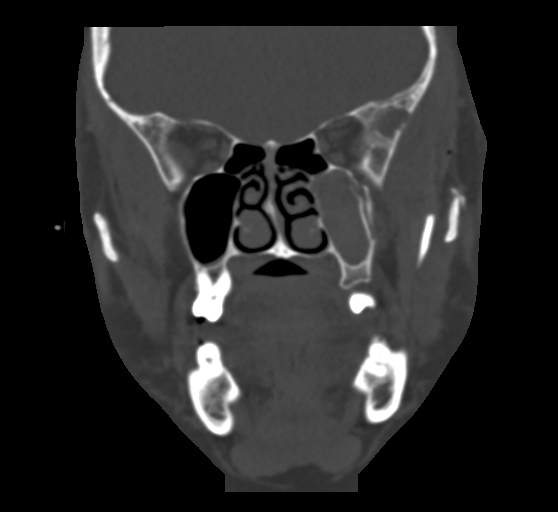
[im 41/74  bone]
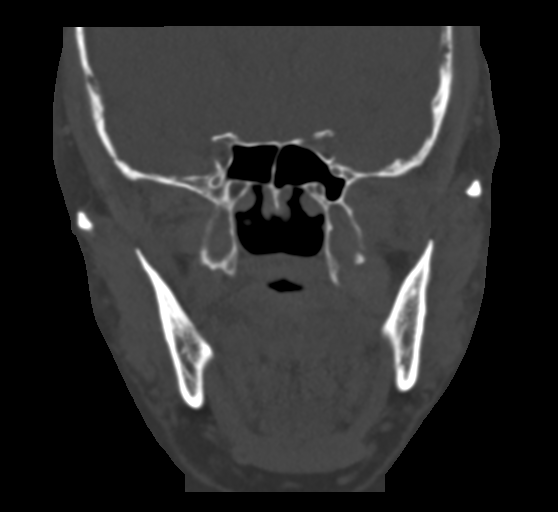

[Series 7: sagittal soft · sagittal · 0.36mm/px · 3 of 89 slices shown]
[im 30/89  bone]
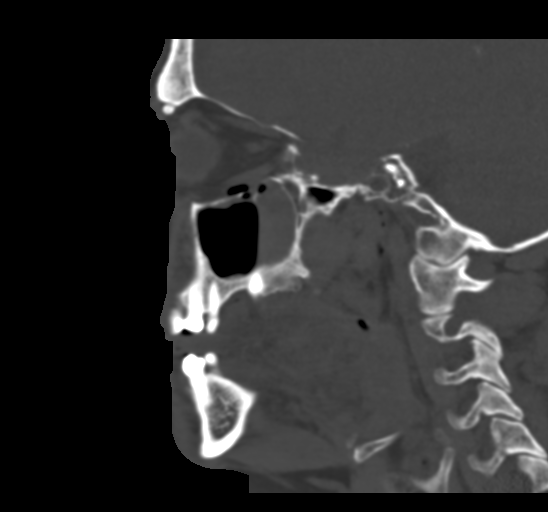
[im 45/89  bone]
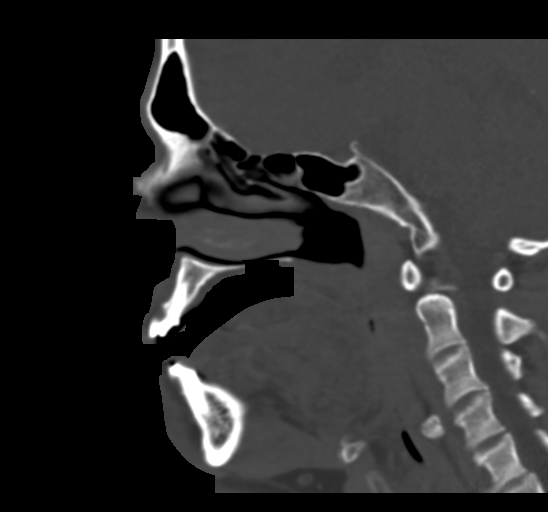
[im 59/89  bone]
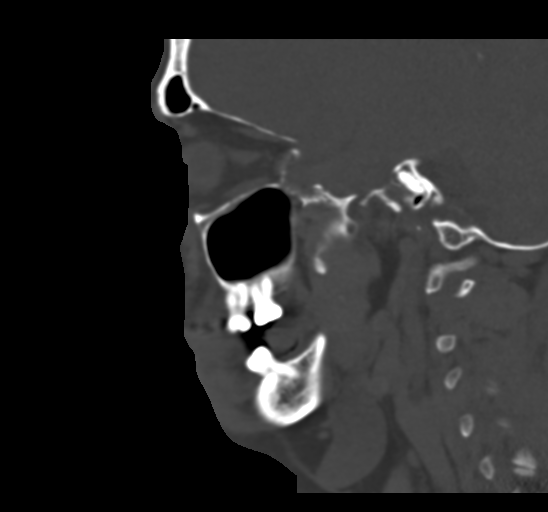

[16 of 47 positions shown; findings below may reference images not displayed]

FINDINGS: Osseous: Multiple fractures are noted as follows:

A comminuted LEFT orbital floor fracture without definite
displacement. No gross ocular muscle herniation noted.

Fracture of the LATERAL LEFT orbital wall with some depression of
the LATERAL orbital wall in 6 mm displacement at the junction of the
LATERAL orbital wall and LEFT zygoma..

Nondisplaced MEDIAL LEFT orbital wall fracture.

Nondisplaced fractures of the LEFT zygoma and LEFT nasal bone.

Fractures of the ANTERIOR, LATERAL and MEDIAL LEFT maxillary sinus
walls as well as the anterior maxillary process.

The pterygoid plates are intact.

Orbits: Fractures as described above. The globes retain their
spherical shape. No intraconal abnormality.

Sinuses: Fluid/blood within the LEFT maxillary sinus noted.

Soft tissues: Facial soft tissue swelling is identified,
LEFT-greater-than-RIGHT. Small foci of gas within the LEFT facial
soft tissues noted.

Limited intracranial: No acute abnormality.
IMPRESSION: 1. Facial fractures as described including the LEFT orbital floor
without definite displacement or gross extraocular muscle
herniation, MEDIAL and LATERAL LEFT orbital wall, LEFT zygoma, LEFT
nasal bone, and LEFT maxillary sinus walls. Overlying soft tissue
swelling.

## 2020-03-07 ENCOUNTER — Ambulatory Visit: Payer: PRIVATE HEALTH INSURANCE | Attending: Internal Medicine

## 2020-03-07 DIAGNOSIS — Z23 Encounter for immunization: Secondary | ICD-10-CM

## 2020-03-07 NOTE — Progress Notes (Signed)
   Covid-19 Vaccination Clinic  Name:  Mario Day    MRN: [id number] DOB: Jan 06, 1971  03/07/2020  [name] was observed post Covid-19 immunization for 15 minutes without incident. He was provided with Vaccine Information Sheet and instruction to access the V-Safe system.   [name] was instructed to call 911 with any severe reactions post vaccine: Marland Kitchen Difficulty breathing  . Swelling of face and throat  . A fast heartbeat  . A bad rash all over body  . Dizziness and weakness   Immunizations Administered    Name Date Dose VIS Date Route   Pfizer COVID-19 Vaccine 03/07/2020 11:07 AM 0.3 mL 11/13/2019 Intramuscular   Manufacturer: [employer], [employer]   Lot: [id number]   NDC: [address]-3

## 2020-03-28 ENCOUNTER — Ambulatory Visit: Payer: PRIVATE HEALTH INSURANCE | Attending: Internal Medicine

## 2020-03-28 ENCOUNTER — Ambulatory Visit: Payer: Self-pay

## 2020-03-28 DIAGNOSIS — Z23 Encounter for immunization: Secondary | ICD-10-CM

## 2020-03-28 NOTE — Progress Notes (Signed)
   Covid-19 Vaccination Clinic  Name:  Mario Day    MRN: [id number] DOB: 01-19-1971  03/28/2020  Mario Day was observed post Covid-19 immunization for 30 minutes based on pre-vaccination screening without incident. He was provided with Vaccine Information Sheet and instruction to access the V-Safe system.   Mario Day was instructed to call 911 with any severe reactions post vaccine: Marland Kitchen Difficulty breathing  . Swelling of face and throat  . A fast heartbeat  . A bad rash all over body  . Dizziness and weakness   Immunizations Administered    Name Date Dose VIS Date Route   Pfizer COVID-19 Vaccine 03/28/2020 11:00 AM 0.3 mL 01/27/2019 Intramuscular   Manufacturer: [employer], [employer]   Lot: [id number]   NDC: [address]-1

## 2021-06-02 DEATH — deceased
